# Patient Record
Sex: Male | Born: 1995 | Race: White | Hispanic: No | Marital: Single | Smoking: Current some day smoker
Health system: Southern US, Community
[De-identification: ages and names within clinical notes are randomized; demographics above are authoritative.]

## PROBLEM LIST (undated history)

## (undated) HISTORY — PX: WISDOM TOOTH EXTRACTION: SHX21

## (undated) HISTORY — PX: CYST REMOVAL NECK: SHX6281

---

## 2017-08-12 ENCOUNTER — Other Ambulatory Visit: Payer: Self-pay

## 2017-08-12 ENCOUNTER — Emergency Department (HOSPITAL_COMMUNITY): Payer: 59

## 2017-08-12 ENCOUNTER — Emergency Department (HOSPITAL_COMMUNITY)
Admission: EM | Admit: 2017-08-12 | Discharge: 2017-08-12 | Disposition: A | Discharge status: Home / Self Care | Payer: 59 | Attending: Emergency Medicine | Admitting: Emergency Medicine

## 2017-08-12 ENCOUNTER — Encounter (HOSPITAL_COMMUNITY): Payer: Self-pay | Admitting: Emergency Medicine

## 2017-08-12 DIAGNOSIS — M25532 Pain in left wrist: Secondary | ICD-10-CM

## 2017-08-12 MED ORDER — NAPROXEN 500 MG PO TABS: 500.0000 mg | ORAL_TABLET | Freq: Two times a day (BID) | ORAL | 0 refills | Status: AC

## 2017-08-12 MED ORDER — NAPROXEN 250 MG PO TABS
500.0000 mg | ORAL_TABLET | Freq: Once | ORAL | Status: AC
  Administered 2017-08-12: 500 mg via ORAL
  Filled 2017-08-12: qty 2

## 2017-08-12 NOTE — Discharge Instructions (Addendum)
Your xray is completely negative today.  Your exam and the history of your wrist pain suggests possible carpal tunnel syndrome. I have printed information for you about this condition.   Wear your wrist splint over night to rest the wrist joint as discussed. Heat applied to the site may also be helpful by using a heating pad or a warm pan of water.  Call the orthopedist listed above for further evaluation if your symptoms persist.

## 2017-08-12 NOTE — ED Triage Notes (Signed)
L wrist pain and cracking for several years  Has been evaluated in the past

## 2017-08-13 ENCOUNTER — Encounter (HOSPITAL_COMMUNITY): Payer: Self-pay | Admitting: Emergency Medicine

## 2017-08-13 NOTE — ED Provider Notes (Signed)
[facility] EMERGENCY DEPARTMENT Provider Note   CSN: [id number] Arrival date & time: 08/12/17  2106     History   Chief Complaint Chief Complaint  Patient presents with  . Wrist Pain    HPI Jared Rich is a 22 y.o. male presenting with acute on chronic intermittent pain in his left wrist.  Reports distant history of fall with a foosh type mechanism which he never had evaluated as his pain sx improved but since the event, has had episodes of pain and constant clicking sensation felt over the radial dorsal wrist with (demonstrated) ulnar deviation of the wrist joint. 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 He has found no alleviators. He denies weakness in the extremity. He is right handed.  The history is provided by the patient.    History reviewed. No pertinent past medical history.  There are no active problems to display for this patient.   Past Surgical History:  Procedure Laterality Date  . CYST REMOVAL NECK    . WISDOM TOOTH EXTRACTION          Home Medications    Prior to Admission medications   Medication Sig Start Date End Date Taking? Authorizing Provider  naproxen (NAPROSYN) 500 MG tablet Take 1 tablet (500 mg total) by mouth 2 (two) times daily. 08/12/17   Burgess AmorIdol, Mukhtar Shams, PA-C    Family History No family history on file.  Social History Social History   Tobacco Use  . Smoking status: Not on file  Substance Use Topics  . Alcohol use: Not on file  . Drug use: Not on file     Allergies   Patient has no known allergies.   Review of Systems Review of Systems  Constitutional: Negative for fever.  Musculoskeletal: Positive for arthralgias. Negative for joint swelling and myalgias.  Neurological: Negative for weakness and numbness.     Physical Exam Updated Vital Signs BP (!)  158/98 (BP Location: Right Arm)   Pulse (!) 102   Temp 99.7 F (37.6 C) (Oral)   Resp 20   Ht 6' (1.829 m)   Wt 99.8 kg (220 lb)   SpO2 100%   BMI 29.84 kg/m   Physical Exam  Constitutional: He appears well-developed and well-nourished.  HENT:  Head: Atraumatic.  Neck: Normal range of motion.  Cardiovascular:  Pulses equal bilaterally  Musculoskeletal: He exhibits tenderness.       Left wrist: He exhibits bony tenderness, crepitus and deformity. He exhibits no swelling and no effusion.  ttp radial dorsal wrist, no  Edema or deformity. Subtle click sensation with ulnar deviation. Distal sensation intact. Positive tinels.  Forearm and elbow nontender. Equal grip strength. Radial pulse full, distal cap refill normal.  Neurological: He is alert. He has normal strength. He displays normal reflexes. No sensory deficit.  Skin: Skin is warm and dry.  Psychiatric: He has a normal mood and affect.     ED Treatments / Results  Labs (all labs ordered are listed, but only abnormal results are displayed) Labs Reviewed - No data to display  EKG None  Radiology Dg Wrist Complete Left  Result Date: 08/12/2017 CLINICAL DATA:  Chronic pain EXAM: LEFT WRIST - COMPLETE 3+ VIEW COMPARISON:  None. FINDINGS: Frontal, oblique, lateral, and ulnar deviation scaphoid images were obtained. There is no appreciable fracture or dislocation.  The joint spaces appear normal. No erosive change. IMPRESSION: No fracture or dislocation.  No evident arthropathy. Electronically Signed   By: Bretta Bang III M.D.   On: 08/12/2017 21:52    Procedures Procedures (including critical care time)  Medications Ordered in ED Medications  naproxen (NAPROSYN) tablet 500 mg (500 mg Oral Given 08/12/17 2234)     Initial Impression / Assessment and Plan / ED Course  I have reviewed the triage vital signs and the nursing notes.  Pertinent labs & imaging results that were available during my care of the patient were  reviewed by me and considered in my medical decision making (see chart for details).     Imaging reviewed and discussed with pt. 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  He owns a velcro wrist splint, he was advised to wear at night to rest the wrist joint, discussed heat tx, nsaids. Ortho referral given.  Advised to avoid the jack hammer activity at work if he can which he states he can. Referral given for further eval of chronic and new sx.  Final Clinical Impressions(s) / ED Diagnoses   Final diagnoses:  Left wrist pain    ED Discharge Orders        Ordered    naproxen (NAPROSYN) 500 MG tablet  2 times daily     08/12/17 2221       Burgess Amor, PA-C 08/13/17 1259    Loren Racer, MD 08/16/17 1539

## 2019-06-11 IMAGING — DX DG WRIST COMPLETE 3+V*L*
4 series · 4 of 4 positions shown · non-contrast
Comparison: None.

CLINICAL DATA: Chronic pain

EXAM:
LEFT WRIST - COMPLETE 3+ VIEW

[wrist pa]
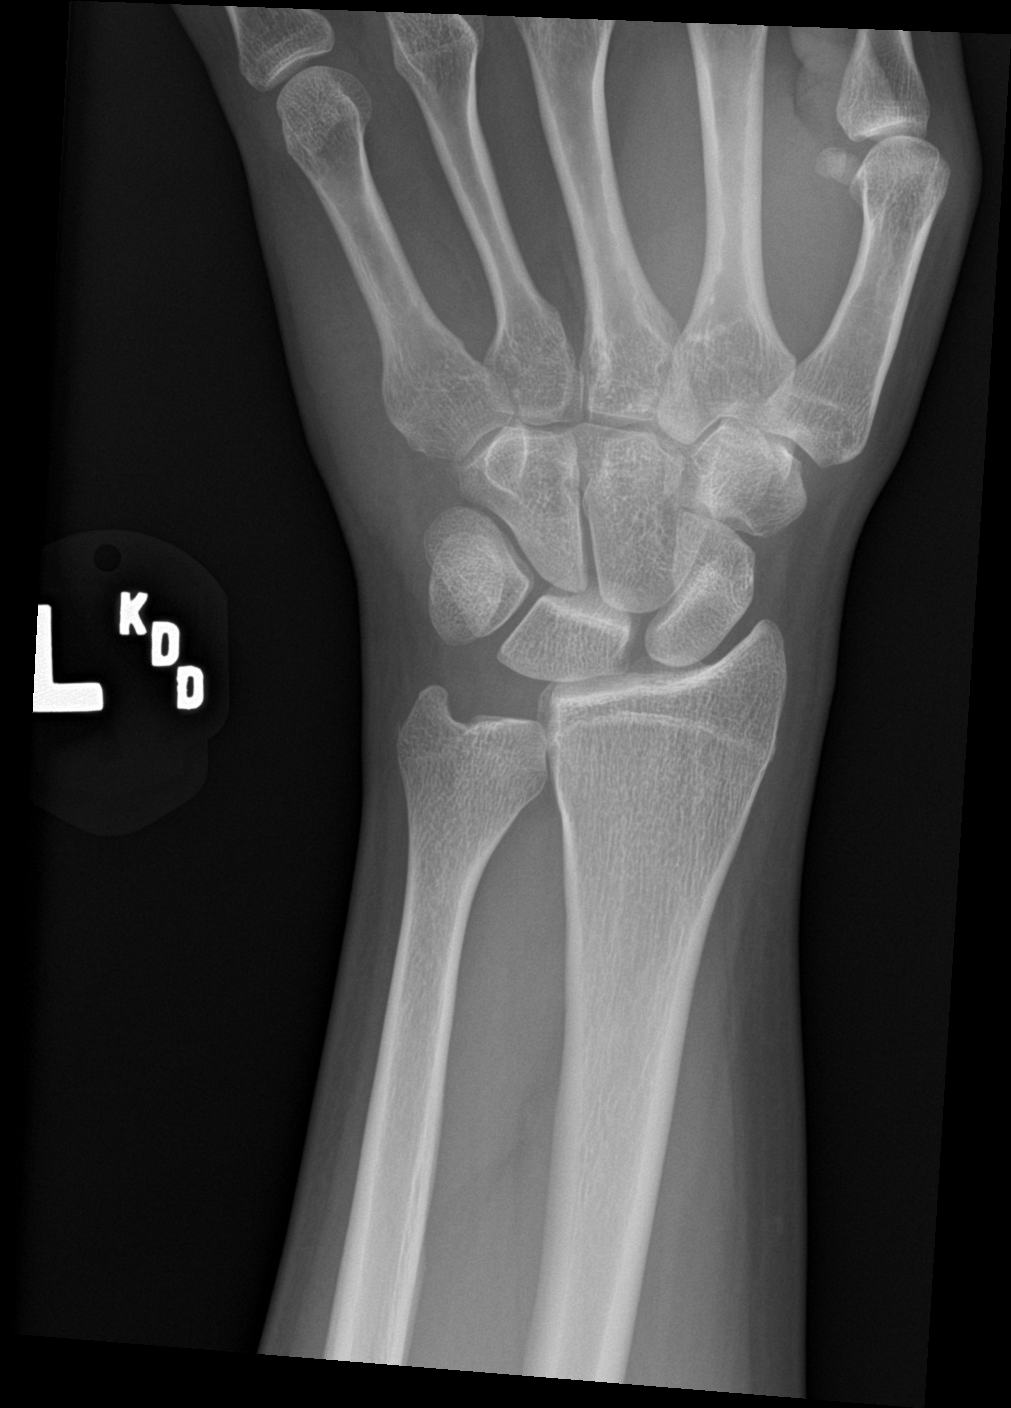

[wrist obl]
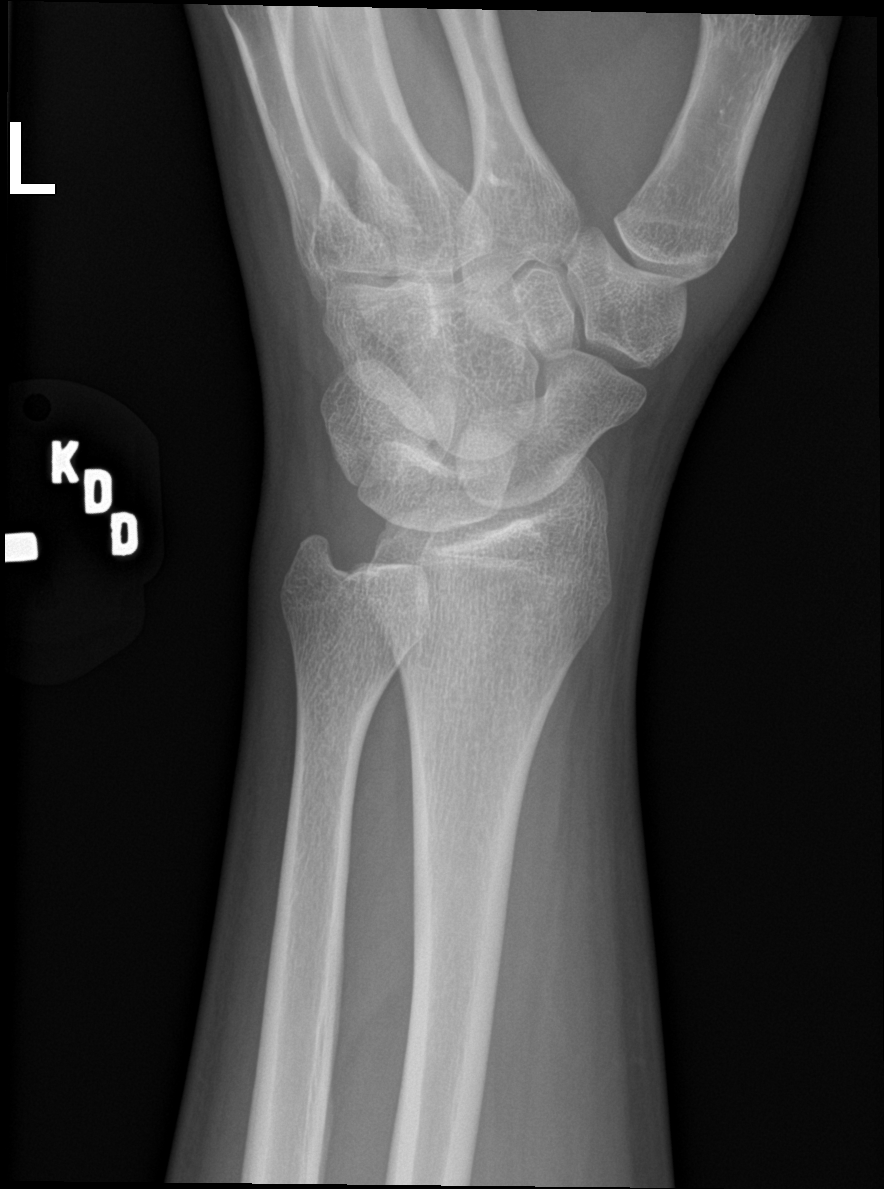

[wrist lat]
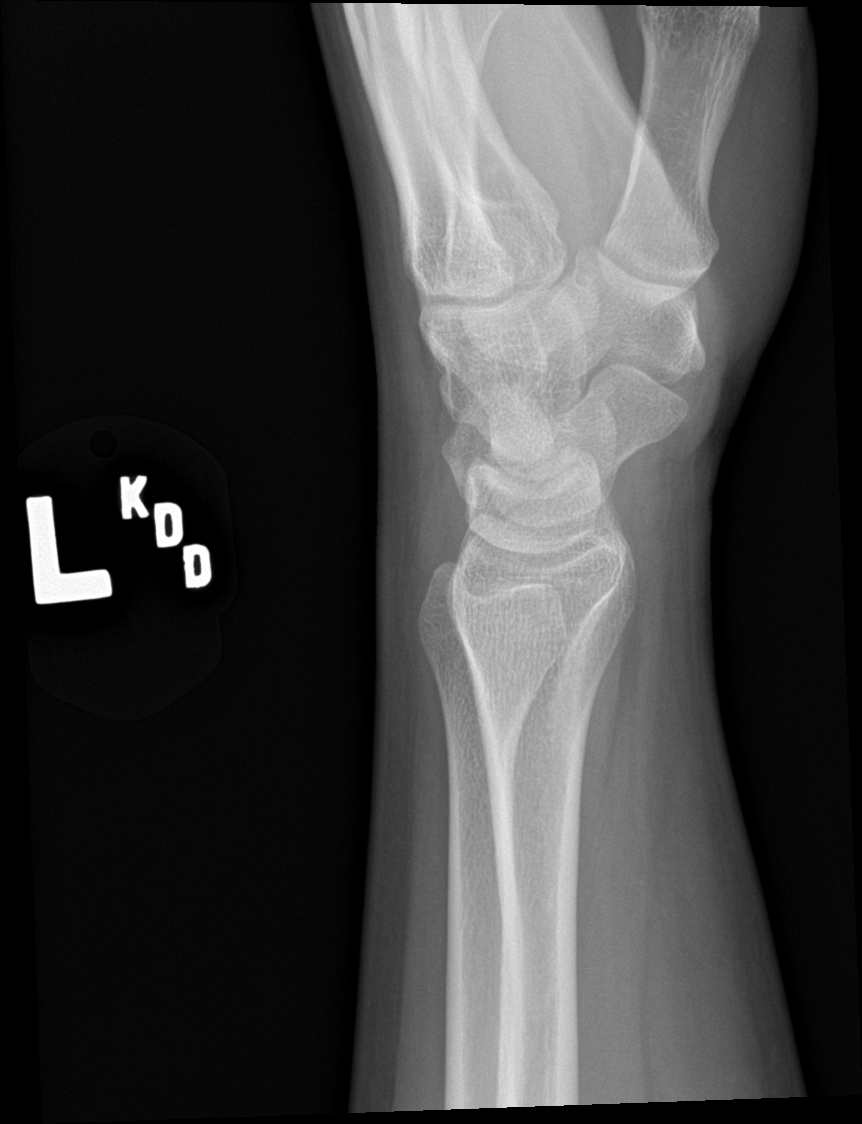

[wrist navicular]
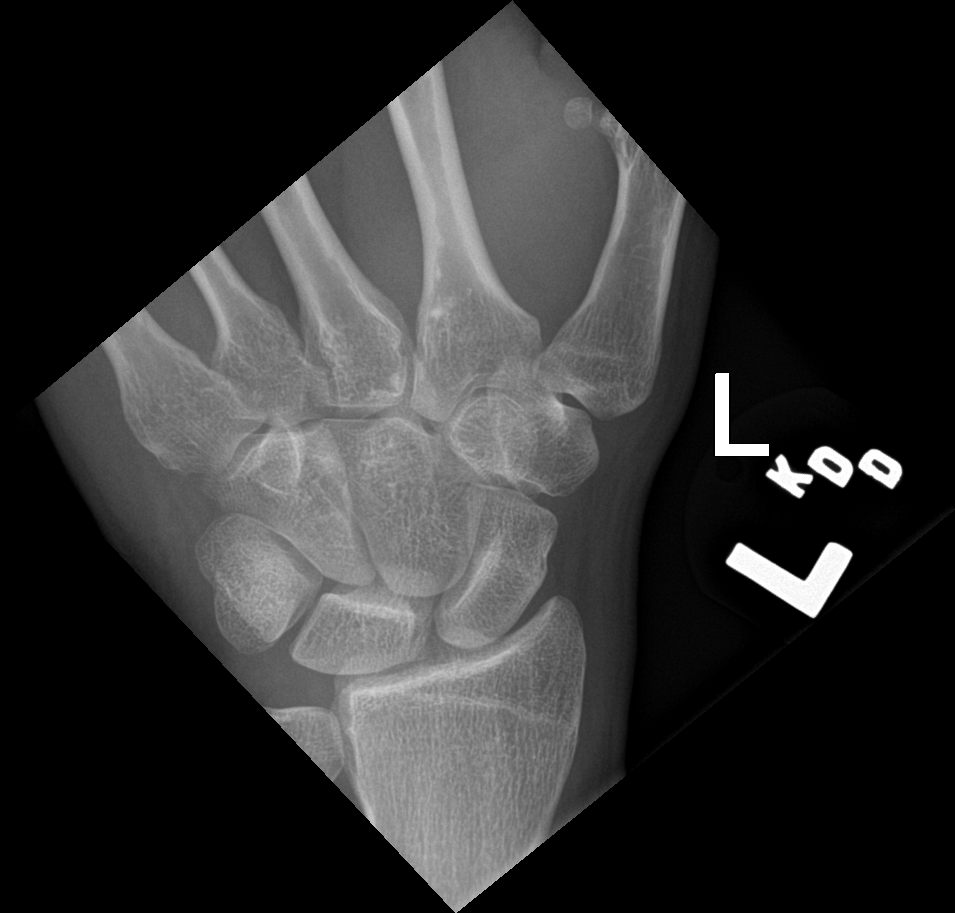

[4 of 4 positions shown; findings below may reference images not displayed]

FINDINGS: Frontal, oblique, lateral, and ulnar deviation scaphoid images were
obtained. There is no appreciable fracture or dislocation. The joint
spaces appear normal. No erosive change.
IMPRESSION: No fracture or dislocation.  No evident arthropathy.

## 2021-11-02 ENCOUNTER — Ambulatory Visit
Admission: EM | Admit: 2021-11-02 | Discharge: 2021-11-02 | Disposition: A | Discharge status: Home / Self Care | Payer: PRIVATE HEALTH INSURANCE | Attending: Family Medicine | Admitting: Family Medicine

## 2021-11-02 ENCOUNTER — Encounter: Payer: Self-pay | Admitting: Emergency Medicine

## 2021-11-02 DIAGNOSIS — J069 Acute upper respiratory infection, unspecified: Secondary | ICD-10-CM

## 2021-11-02 DIAGNOSIS — R509 Fever, unspecified: Secondary | ICD-10-CM

## 2021-11-02 NOTE — ED Provider Notes (Signed)
[facility]    CSN: [id number] Arrival date & time: 11/02/21  1530      History   Chief Complaint No chief complaint on file.   HPI Jared Rich is a 26 y.o. male.   Presenting today with 3-day history of fever, chills, sweats, body aches, congestion, cough.  States overall symptoms improving with still having chills and mild body aches, headaches.  Denies rashes, chest pain, shortness of breath, abdominal pain, nausea, vomiting, diarrhea, sore throat.  Trying some over-the-counter headache medication with minimal relief.  No known sick contacts recently.  States his mom was concerned that some tick bites he got about a month ago could be involved.    History reviewed. No pertinent past medical history.  There are no problems to display for this patient.   Past Surgical History:  Procedure Laterality Date   CYST REMOVAL NECK     WISDOM TOOTH EXTRACTION         Home Medications    Prior to Admission medications   Medication Sig Start Date End Date Taking? Authorizing Provider  naproxen (NAPROSYN) 500 MG tablet Take 1 tablet (500 mg total) by mouth 2 (two) times daily. 08/12/17   Burgess Amor, PA-C    Family History History reviewed. No pertinent family history.  Social History Social History   Tobacco Use   Smoking status: Some Days    Types: Cigarettes   Smokeless tobacco: Never  Vaping Use   Vaping Use: Every day   Substances: Nicotine  Substance Use Topics   Alcohol use: Not Currently   Drug use: Yes    Types: Marijuana     Allergies   Patient has no known allergies.   Review of Systems Review of Systems Per HPI  Physical Exam Triage Vital Signs ED Triage Vitals  Enc Vitals Group     BP 11/02/21 1532 140/86     Pulse Rate 11/02/21 1532 89     Resp 11/02/21 1532 18     Temp 11/02/21 1532 99.1 F (37.3 C)     Temp Source 11/02/21 1532 Oral     SpO2 11/02/21 1532 98 %     Weight --      Height --      Head Circumference --       Peak Flow --      Pain Score 11/02/21 1533 3     Pain Loc --      Pain Edu? --      Excl. in GC? --    No data found.  Updated Vital Signs BP 140/86 (BP Location: Right Arm)   Pulse 89   Temp 99.1 F (37.3 C) (Oral)   Resp 18   SpO2 98%   Visual Acuity Right Eye Distance:   Left Eye Distance:   Bilateral Distance:    Right Eye Near:   Left Eye Near:    Bilateral Near:     Physical Exam Vitals and nursing note reviewed.  Constitutional:      Appearance: Normal appearance.  HENT:     Head: Atraumatic.     Nose: Rhinorrhea present.     Mouth/Throat:     Mouth: Mucous membranes are moist.     Pharynx: Oropharynx is clear. Posterior oropharyngeal erythema present. No oropharyngeal exudate.  Eyes:     Extraocular Movements: Extraocular movements intact.     Conjunctiva/sclera: Conjunctivae normal.  Cardiovascular:     Rate and Rhythm: Normal rate and regular rhythm.  Pulmonary:  Effort: Pulmonary effort is normal.     Breath sounds: Normal breath sounds.  Musculoskeletal:        General: Normal range of motion.     Cervical back: Normal range of motion and neck supple.  Lymphadenopathy:     Cervical: No cervical adenopathy.  Skin:    General: Skin is warm and dry.     Findings: No rash.  Neurological:     General: No focal deficit present.     Mental Status: He is oriented to person, place, and time.  Psychiatric:        Mood and Affect: Mood normal.        Thought Content: Thought content normal.        Judgment: Judgment normal.      UC Treatments / Results  Labs (all labs ordered are listed, but only abnormal results are displayed) Labs Reviewed  NOVEL CORONAVIRUS, NAA    EKG   Radiology No results found.  Procedures Procedures (including critical care time)  Medications Ordered in UC Medications - No data to display  Initial Impression / Assessment and Plan / UC Course  I have reviewed the triage vital signs and the nursing  notes.  Pertinent labs & imaging results that were available during my care of the patient were reviewed by me and considered in my medical decision making (see chart for details).     Low suspicion for tickborne illness given lack of rash, arthralgias, delayed timeframe and upper respiratory symptoms involved.  Offered tickborne labs but patient declines at this time.  COVID PCR pending, treat with supportive care and over-the-counter medications, return precautions reviewed.  Final Clinical Impressions(s) / UC Diagnoses   Final diagnoses:  Viral URI  Fever, unspecified   Discharge Instructions   None    ED Prescriptions   None    PDMP not reviewed this encounter.   Particia Nearing, [place] 11/02/21 1644

## 2021-11-02 NOTE — ED Triage Notes (Signed)
Chills and sweating since Monday, body aches, nasal congestion and cough.  States he pulled several ticks off a month ago.

## 2021-11-03 LAB — NOVEL CORONAVIRUS, NAA: SARS-CoV-2, NAA: DETECTED — AB
